# Patient Record
Sex: Female | Born: 1960 | Race: Black or African American | Hispanic: No | Smoking: Current every day smoker
Health system: Southern US, Community
[De-identification: ages and names within clinical notes are randomized; demographics above are authoritative.]

## PROBLEM LIST (undated history)

## (undated) DIAGNOSIS — I10 Essential (primary) hypertension: Secondary | ICD-10-CM

## (undated) DIAGNOSIS — N63 Unspecified lump in unspecified breast: Secondary | ICD-10-CM

## (undated) HISTORY — DX: Essential (primary) hypertension: I10

---

## 1998-01-03 HISTORY — PX: ABDOMINAL HYSTERECTOMY: SHX81

## 2003-06-12 ENCOUNTER — Emergency Department (HOSPITAL_COMMUNITY): Admission: EM | Admit: 2003-06-12 | Discharge: 2003-06-12 | Payer: Self-pay | Admitting: Emergency Medicine

## 2003-06-28 ENCOUNTER — Emergency Department (HOSPITAL_COMMUNITY): Admission: EM | Admit: 2003-06-28 | Discharge: 2003-06-29 | Payer: Self-pay | Admitting: Emergency Medicine

## 2003-07-06 ENCOUNTER — Emergency Department (HOSPITAL_COMMUNITY): Admission: EM | Admit: 2003-07-06 | Discharge: 2003-07-06 | Payer: Self-pay | Admitting: Emergency Medicine

## 2008-07-18 ENCOUNTER — Emergency Department (HOSPITAL_COMMUNITY): Admission: EM | Admit: 2008-07-18 | Discharge: 2008-07-18 | Payer: Self-pay | Admitting: Emergency Medicine

## 2014-03-31 ENCOUNTER — Encounter (HOSPITAL_COMMUNITY): Payer: Self-pay | Admitting: Neurology

## 2014-03-31 ENCOUNTER — Emergency Department (HOSPITAL_COMMUNITY)
Admission: EM | Admit: 2014-03-31 | Discharge: 2014-03-31 | Disposition: A | Discharge status: Home / Self Care | Payer: Self-pay | Attending: Emergency Medicine | Admitting: Emergency Medicine

## 2014-03-31 DIAGNOSIS — R51 Headache: Secondary | ICD-10-CM | POA: Insufficient documentation

## 2014-03-31 DIAGNOSIS — Z87891 Personal history of nicotine dependence: Secondary | ICD-10-CM | POA: Insufficient documentation

## 2014-03-31 DIAGNOSIS — I10 Essential (primary) hypertension: Secondary | ICD-10-CM | POA: Insufficient documentation

## 2014-03-31 DIAGNOSIS — Z880 Allergy status to penicillin: Secondary | ICD-10-CM | POA: Insufficient documentation

## 2014-03-31 LAB — BASIC METABOLIC PANEL WITH GFR
Anion gap: 9 (ref 5–15)
BUN: <5 mg/dL — ABNORMAL LOW (ref 6–23)
CO2: 25 mmol/L (ref 19–32)
Calcium: 9.5 mg/dL (ref 8.4–10.5)
Chloride: 105 mmol/L (ref 96–112)
Creatinine, Ser: 0.64 mg/dL (ref 0.50–1.10)
GFR calc Af Amer: >90 mL/min
GFR calc non Af Amer: >90 mL/min
Glucose, Bld: 92 mg/dL (ref 70–99)
Potassium: 4 mmol/L (ref 3.5–5.1)
Sodium: 139 mmol/L (ref 135–145)

## 2014-03-31 LAB — CBC WITH DIFFERENTIAL/PLATELET
BASOS ABS: 0 10*3/uL (ref 0.0–0.1)
Basophils Relative: 0 % (ref 0–1)
EOS PCT: 2 % (ref 0–5)
Eosinophils Absolute: 0.2 10*3/uL (ref 0.0–0.7)
HCT: 43.9 % (ref 36.0–46.0)
Hemoglobin: 14.5 g/dL (ref 12.0–15.0)
LYMPHS ABS: 3.8 10*3/uL (ref 0.7–4.0)
LYMPHS PCT: 39 % (ref 12–46)
MCH: 27.7 pg (ref 26.0–34.0)
MCHC: 33 g/dL (ref 30.0–36.0)
MCV: 83.8 fL (ref 78.0–100.0)
Monocytes Absolute: 1 10*3/uL (ref 0.1–1.0)
Monocytes Relative: 10 % (ref 3–12)
NEUTROS ABS: 4.9 10*3/uL (ref 1.7–7.7)
NEUTROS PCT: 50 % (ref 43–77)
PLATELETS: 169 10*3/uL (ref 150–400)
RBC: 5.24 MIL/uL — AB (ref 3.87–5.11)
RDW: 14.6 % (ref 11.5–15.5)
WBC: 9.9 10*3/uL (ref 4.0–10.5)

## 2014-03-31 MED ORDER — HYDROCHLOROTHIAZIDE 25 MG PO TABS
25.0000 mg | ORAL_TABLET | Freq: Every day | ORAL | Status: DC
  Administered 2014-03-31: 25 mg via ORAL
  Filled 2014-03-31: qty 1

## 2014-03-31 MED ORDER — HYDROCHLOROTHIAZIDE 25 MG PO TABS: 25.0000 mg | ORAL_TABLET | Freq: Every day | ORAL | Status: DC

## 2014-03-31 NOTE — ED Provider Notes (Signed)
CSN: [id number]     Arrival date & time 03/31/14  1413 History   First MD Initiated Contact with Patient 03/31/14 1630     Chief Complaint  Patient presents with  . 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 One month ago she had a blood pressure check at work or heart month. It was noted to be high, she is not room in the numbers. Patient stopped smoking and make some diet changes. She's been having intermittent gradual headaches for the past 2 weeks and so she went had a blood pressure recheck today. Patient was told her blood pressure was 135/106. On recheck it was 141/100. She was advised to come to the ER to get evaluation. She's not currently have a headache. She states every time she gets a headache is gradual and it seems per grossly worsen until she goes to sleep. She's not sure if is related to quitting smoking or diet changes. She denies chest pain, weakness, blurred vision, or urinary changes. Feels normal at this time. She does not have a primary doctor has never had hypertension.  History reviewed. No pertinent past medical history. History reviewed. No pertinent past surgical history. No family history on file. History  Substance Use Topics  . Smoking status: Former Games developer  . Smokeless tobacco: Not on file  . Alcohol Use: No   OB History    No data available     Review of Systems  Constitutional: Negative for fever.  Eyes: Negative for visual disturbance.  Respiratory: Negative for shortness of breath.   Cardiovascular: Negative for chest pain.  Gastrointestinal: Negative for vomiting and abdominal pain.  Genitourinary: Negative for dysuria and flank pain.  Musculoskeletal: Negative for back pain.  Neurological: Positive for headaches. Negative for dizziness and weakness.  All other systems reviewed and  are negative.     Allergies  Iohexol and Penicillins  Home Medications   Prior to Admission medications   Not on File   BP 159/101 mmHg  Pulse 67  Temp(Src) 98.2 F (36.8 C)  Resp 15  Ht  (1.651 m)  Wt 104 lb (47.174 kg)  BMI 17.31 kg/m2  SpO2 100% Physical Exam  Constitutional: She is oriented to person, place, and time. She appears well-developed and well-nourished.  HENT:  Head: Normocephalic and atraumatic.  Right Ear: External ear normal.  Left Ear: External ear normal.  Nose: Nose normal.  Eyes: EOM are normal. Pupils are equal, round, and reactive to light. Right eye exhibits no discharge. Left eye exhibits no discharge.  Neck: Normal range of motion. Neck supple.  No meningismus  Cardiovascular: Normal rate, regular rhythm and normal heart sounds.   Pulmonary/Chest: Effort normal and breath sounds normal.  Abdominal: Soft. There is no tenderness.  Neurological: She is alert and oriented to person, place, and time.  CN 2-12 grossly intact. 5/5 strength in all 4 extremities  Skin: Skin is warm and dry.  Nursing note and vitals reviewed.   ED Course  Procedures (including critical care time) Labs Review Labs Reviewed  BASIC METABOLIC PANEL - Abnormal; Notable for the following:    BUN <5 (*)    All other components within normal limits  CBC WITH DIFFERENTIAL/PLATELET - Abnormal; Notable for the following:    RBC 5.24 (*)    All other components within normal limits    Imaging Review No results found.  EKG Interpretation None      MDM   Final diagnoses:  Essential hypertension    Patient has a normal exam here. No current headache. Does have hypertension given she had hypertension one month ago I feel she is amenable to be treated. I recommended she follow-up with a PCP or further blood pressure control. Discussed strict return precautions as well as instructions on her medicines. Denies any signs or symptoms of endorgan  damage.    Pricilla LovelessScott Khaya Theissen, MD 03/31/14 [phone]

## 2014-03-31 NOTE — ED Notes (Signed)
Pt reports that her bp was high two weeks ago when checked at work, pt states she was advised by the nurse at work to change her diet and stop smoking which she states she has. Has had head/neck ache for about 1 week, states that she had bp checked again by nurse at work and it was high so nurse advised her to come in. Denies any visual changes, dizziness, or nausea/vomiting. Pt alert, oriented, nad.

## 2014-03-31 NOTE — ED Notes (Signed)
MD at bedside. 

## 2014-03-31 NOTE — Discharge Instructions (Signed)
°Emergency Department Resource Guide °1) Find a Doctor and Pay Out of Pocket °Although you won't have to find out who is covered by your insurance plan, it is a good idea to ask around and get recommendations. You will then need to call the office and see if the doctor you have chosen will accept you as a new patient and what types of options they offer for patients who are self-pay. Some doctors offer discounts or will set up payment plans for their patients who do not have insurance, but you will need to ask so you aren't surprised when you get to your appointment. ° °2) Contact Your Local Health Department °Not all health departments have doctors that can see patients for sick visits, but many do, so it is worth a call to see if yours does. If you don't know where your local health department is, you can check in your phone book. The CDC also has a tool to help you locate your state's health department, and many state websites also have listings of all of their local health departments. ° °3) Find a Walk-in Clinic °If your illness is not likely to be very severe or complicated, you may want to try a walk in clinic. These are popping up all over the country in pharmacies, drugstores, and shopping centers. They're usually staffed by nurse practitioners or physician assistants that have been trained to treat common illnesses and complaints. They're usually fairly quick and inexpensive. However, if you have serious medical issues or chronic medical problems, these are probably not your best option. ° °No Primary Care Doctor: °- Call [facility] at  832-8000 - they can help you locate a primary care doctor that  accepts your insurance, provides certain services, etc. °- Physician Referral Service- 1-800-533-3463 ° °Chronic Pain Problems: °Organization         Address  Phone   Notes  °[facility]  (336) 297-2271 Patients need to be referred by their primary care doctor.  ° °Medication  Assistance: °Organization         Address  Phone   Notes  °[place] [facility] [address] °[place], [place] [address] (336) 641-8030 --Must be a resident of [place] °-- Must have NO insurance coverage whatsoever (no Medicaid/ Medicare, etc.) °-- The pt. MUST have a primary care doctor that directs their care regularly and follows them in the community °  °[facility]  (866) 331-1348   °[employer]  (888) 892-1162   ° °Agencies that provide inexpensive medical care: °Organization         Address  Phone   Notes  °[facility]  (336) 832-8035   °[facility]    (336) 832-7272   °[facility] [address] °[place], [place] [address] (336) 832-4777   °[facility] [place] [address], °[place] (336) 271-4999   °[facility]    (336) 373-0678   °[facility]    (336) 272-1050   °[facility] ° [address], [place] Phone:  (336) 832-4444, Fax:  (336) 832-4440 Hours of Operation:  9 am - 6 pm, M-F.  Also accepts Medicaid/Medicare and self-pay.  °[facility] ° [address],  Phone: (336) 832-3150, Fax: (336) 832-3151. Hours of Operation:  8:30 am - 5:30 pm, M-F.  Also accepts Medicaid and self-pay.  °[facility] [place] [address], [place] Phone: (336) 878-6027   °[facility] [address], [place], [place] (336)723-1848, Ext. 123 Mondays & Thursdays: 7-9 AM.  First 15 patients are seen on a first come, first serve basis. °  ° °Medicaid-accepting [place] Providers: ° °Organization         Address  Phone   Notes  °[facility] [address], [place] (336) 641-2100 Also accepts self-pay patients.  °[facility] [address], [place] ° (336) 856-9996   °[facility] [address], [place]  (336) 288-8857   °[facility] [address] [place] [address], [place] (336) 299-7000   °Veita Bland [address], [place]  ° (336) 373-1557 Only accepts [place] Access Medicaid patients after they have their name applied to their card.  ° °Self-Pay (no insurance) in [place]: ° °Organization         Address  Phone   Notes  °Sickle Cell Patients, [facility] [address], [place] (336) 832-1970   °[facility] [address], [place] (336) 832-4400   °[facility] ° [address], [place] (336) 992-4800   °[facility]/Dr. Osei-Bonsu ° [address] [place] [address], [place] or [address], [place] (336) 841-8500 Phone number for both [place] and [place] locations is the same.  °[facility] [address], [place] (336) 299-0000   °[facility] [address] [place] [address], [place] or [address] (336) 852-7530 °(336) 878-2260   °[facility] [address], [place] (336) 350-1642, phone; (336) 294-5005, fax Sees patients 1st and 3rd Saturday of every month.  Must not qualify for public or private insurance (i.e. Medicaid, Medicare, [place] Health Choice, Veterans' Benefits) • Household income should be no more than 200% of the poverty level •The clinic cannot treat you if you are pregnant or think you are pregnant • Sexually transmitted diseases are not treated at the clinic.  ° ° °Dental Care: °Organization         Address  Phone  Notes  °[place] [facility] [address],  (336) 641-6152 Accepts children up to age 21 who are enrolled in Medicaid or [place] Health Choice; pregnant women with a Medicaid card; and children who have applied for Medicaid or [place] Health Choice, but were declined, whose parents can pay a reduced fee at time of service.  °[place]  [facility] [place]  [address], [place] (336) 641-7733 Accepts children up to age 21 who are enrolled in Medicaid or [place] Health Choice; pregnant women with a Medicaid card; and children who have applied for Medicaid or [place] Health Choice, but were declined, whose parents can pay a reduced fee at time of service.  °[facility] ° [address],  (336) 641-4533 Patients are seen by appointment only. Walk-ins are not accepted. [facility] will see patients 18 years of age and older. °Monday - Tuesday (8am-5pm) °Most Wednesdays (8:30-5pm) °$30 per visit, cash only  °[facility] ° [address], [place] (336) 641-4533 Patients are seen by appointment only. Walk-ins are not accepted. [facility] will see patients 18 years of age and older. °One   Wednesday Evening (Monthly: Volunteer Based).  $30 per visit, cash only  °[facility]  (919) 537-3737 for adults; Children under age 4, call Graduate Pediatric Dentistry at (919) [phone]. Children aged 4-14, please call (919) [phone] to request a pediatric application. ° Dental services are provided in all areas of dental care including fillings, crowns and bridges, complete and partial dentures, implants, gum treatment, root canals, and extractions. Preventive care is also provided. Treatment is provided to both adults and children. °Patients are selected via a lottery and there is often a waiting list. °  °[facility] [address], °[place] ° (336) 763-8833 [email or web address] °  °[facility] [address], [place], [place] (336)723-1848, Ext. 123 Second and Fourth Thursday of each month, opens at 6:30 AM; Clinic ends at 9 AM.  Patients are seen on a first-come first-served basis, and a limited number are seen during each clinic.  ° °[facility] ° [address], [place], [place] (336) 723-7904    Eligibility Requirements °You must have lived in [place], [place], or [place] counties for at least the last three months. °  You cannot be eligible for state or federal sponsored healthcare insurance, including Veterans Administration, Medicaid, or Medicare. °  You generally cannot be eligible for healthcare insurance through your employer.  °  How to apply: °Eligibility screenings are held every Tuesday and Wednesday afternoon from 1:00 pm until 4:00 pm. You do not need an appointment for the interview!  °[facility] [address], [place], [place] 336-631-2330   °[facility]  336-342-8273   °[facility]  336-703-3100   °[facility]  336-570-6415   ° °Behavioral Health Resources in the Community: °Intensive Outpatient Programs °Organization         Address  Phone  Notes  °[facility] [address], [place], [place] 336-878-6098   °[facility] [address], [place], [place] 336-832-9800   °[facility] [address], [place], [place] ° 336-882-2125   °[facility] [address],  °[place], [place] 1-800-853-5163 or 336-641-4981   °Substance Abuse Resources °Organization         Address  Phone  Notes  °[facility]  336-882-2125   °[facility]  336-784-9470   °[facility]  336-285-9073   °[facility]  336-845-3988   °Residential & Outpatient Substance Abuse Program  1-800-659-3381   °Psychological Services °Organization         Address  Phone  Notes  °[facility]  336- 832-9600   °[facility]  336- 378-7881   °[facility] [address], [place] 1-800-853-5163 or 336-641-4981   ° °Mobile Crisis Teams °Organization         Address  Phone  Notes  °[facility], Mobile Crisis Care Unit  1-877-626-1772   °Assertive °[facility] ° [address]  [place], [place] 336-834-9664   °Sharon DeEsch [address] °[place] 336-554-5454   ° °Self-Help/Support Groups °Organization         Address  Phone             Notes  °[facility] [place] - variety of support groups  336- 373-1402 Call for more information  °Narcotics Anonymous (NA), [facility] [address], °[place]   2 meetings at this location  ° °  Residential Treatment Programs °Organization         Address  Phone  Notes  °[facility] [address],    °[place]  1-866-801-8205   °[facility] ° [address], [place], [place] 704-293-8524   °[facility] [address], [place] 336-845-3988 Admissions: 8am-3pm M-F  °[facility] [address]    °[place], [place] 336-841-1104   °[facility] [address], [place], [place] 336-379-7146   °[facility] [address]  °[place], [place] 336-285-9073   °[facility] - Intensive Outpatient [address], , [place] 336-852-3033   °[facility] [address]  °[place], [place] 1-877-615-2722 or 336-784-9470   °[facility] [address] [place], [place] 336-227-7417 Accepts Medicaid  °[facility] [address]  ° [place] 1-800-659-3381 Substance Abuse/Addiction Treatment  ° °[place] Behavioral Health Resources °Organization         Address  Phone  Notes  °[facility]  (888) 581-9988   °Julie Brannon, PhD [address] [place], [place]   (336) 349-5553 or (336) 951-0000   °[facility]   [address] °[place], [place] (336) 349-4454   °[facility] [address], [place], [place] (336) 342-8316 Insurance/Medicaid/sponsorship through [facility]  °[facility] [address]                                    [place], [place] (336) 342-8316 Therapy/tele-psych/case    °[facility] [address]  ° [place], [place] (336) 349-2233    °Dr. Arfeen  (336) 349-4544   °[facility] [place]  [employer] [place] [facility] 1) [address], [place] °2) [address], [place] °3)  [address], [place] (336) 349-3220 °(336) 342-7768 ° °(336) 342-8140   °[place] Child Abuse [facility] (336) 342-1394 or (336) 342-3537 (After Hours)    ° ° °

## 2014-03-31 NOTE — ED Notes (Signed)
Pt reports went to [facility] for check up and found BP 135/106. Pt reports h/a within the last week and stopped smoking 1 week ago. Denies CP or SOB. Reports h.a for 1 week. Pt is a x 4. In NAD

## 2014-04-25 ENCOUNTER — Encounter: Payer: Self-pay | Admitting: Family Medicine

## 2014-04-25 ENCOUNTER — Ambulatory Visit: Payer: Self-pay | Attending: Family Medicine | Admitting: Family Medicine

## 2014-04-25 VITALS — BP 106/75 | HR 88 | Temp 98.2°F | Resp 18 | Ht 64.0 in | Wt 98.6 lb

## 2014-04-25 DIAGNOSIS — Z720 Tobacco use: Secondary | ICD-10-CM | POA: Insufficient documentation

## 2014-04-25 DIAGNOSIS — F17200 Nicotine dependence, unspecified, uncomplicated: Secondary | ICD-10-CM

## 2014-04-25 DIAGNOSIS — I10 Essential (primary) hypertension: Secondary | ICD-10-CM | POA: Insufficient documentation

## 2014-04-25 MED ORDER — HYDROCHLOROTHIAZIDE 12.5 MG PO TABS: 12.5000 mg | ORAL_TABLET | Freq: Every day | ORAL | Status: DC

## 2014-04-25 NOTE — Progress Notes (Signed)
Subjective:    Patient ID: Dawn Vasquez, female    DOB: 17-Oct-1960, 54 y.o.   MRN: [id number]  HPI  Dawn Vasquez is a 54 year old female with newly diagnosed hypertension who was seen at the emergency room at [facility] on 328/16 after she had been referred by the nurse at work due to elevated blood pressures of 135/106 and 141/100.Blood pressure at the ED was 159/101 and she had no other symptoms. She was placed on hydrochlorothiazide which she has been taking to date.  Interval history: Since then she has initiated lifestyle changes, has cut back on her smoking and is working on low-sodium diet. She has been compliant with exercise and is wondering if she can be taken off antihypertensives at this time. She has no other concerns today.    Past Medical History  Diagnosis Date  . Hypertension     Past Surgical History  Procedure Laterality Date  . Abdominal hysterectomy  2000    History   Social History  . Marital Status: Legally Separated    Spouse Name: N/A  . Number of Children: N/A  . Years of Education: N/A   Occupational History  . Not on file.   Social History Main Topics  . Smoking status: Current Every Day Smoker -- 0.50 packs/day  . Smokeless tobacco: Not on file  . Alcohol Use: No     Comment: 04/25/14 Quit drinking beer 1 1/2 months ago   . Drug Use: No  . Sexual Activity: Not on file   Other Topics Concern  . Not on file   Social History Narrative    Allergies  Allergen Reactions  . Iohexol Itching, Swelling, Rash and Other (See Comments)     Desc: 06-29-03 non-ionic reaction, swollen lips and itchy all over/rsm    . Penicillins Itching, Swelling and Rash    No current outpatient prescriptions on file prior to visit.   No current facility-administered medications on file prior to visit.    Review of Systems  Constitutional: normal appearing,  Eyes: PERRLA HENT: Head is atraumatic, normal sinuses, normal oropharynx, normal  appearing tonsils and palate Neck: normal range of motion, no thyromegaly, no JVD cardiovascular: normal rate and rhythm, normal heart sounds, no murmurs, rub or gallop, no pedal edema Respiratory: clear to auscultation bilaterally, no wheezes, no rales, no rhonchi Abdomen: soft, not tender to palpation, normal bowel sounds, no enlarged organs Extremities: Full ROM, no tenderness in joints Skin: warm and dry, no lesions. Neurological: alert, oriented x3, cranial nerves I-XII grossly intact Psychological: normal mood.      Objective: Filed Vitals:   04/25/14 1604  BP: 106/75  Pulse: 88  Temp: 98.2 F (36.8 C)  Resp: 18      Physical Exam  Constitutional: She is oriented to person, place, and time. She appears well-developed and well-nourished. No distress.  thin  HENT:  Head: Normocephalic.  Right Ear: External ear normal.  Left Ear: External ear normal.  Nose: Nose normal.  Mouth/Throat: Oropharynx is clear and moist.  Eyes: Conjunctivae and EOM are normal. Pupils are equal, round, and reactive to light.  Neck: Normal range of motion. No JVD present.  Cardiovascular: Normal rate, regular rhythm, normal heart sounds and intact distal pulses.  Exam reveals no gallop.   No murmur heard. Pulmonary/Chest: Effort normal and breath sounds normal. No respiratory distress. She has no wheezes. She has no rales. She exhibits no tenderness.  Abdominal: Soft. Bowel sounds are normal. She exhibits no  distension and no mass. There is no tenderness.  Musculoskeletal: Normal range of motion. She exhibits no edema or tenderness.  Neurological: She is alert and oriented to person, place, and time. She has normal reflexes.  Skin: Skin is warm and dry. She is not diaphoretic.  Psychiatric: She has a normal mood and affect.            Assessment & Plan:  54 year old female with newly diagnosed hypertension currently on antihypertensive and  blood pressure today is on the low  side.  Essential hypertension: Controlled and blood pressure is tending towards hypertension. This could be explained by lifestyle changes which the patient initiated in the last 1 month and so I am reducing her hydrochlorothiazide from 25 mg to 12.5 mg and she would need to be reassessed at her next office visit for possible discontinuation of the medication. Basic metabolic panel today to assess for hypokalemia which is a side effect of HCTZ.  Tobacco use disorder: Smoking cessation support: smoking cessation hotline: 1-800-QUIT-NOW.  Smoking cessation classes are available through [facility]. Call 470-118-5592551-581-3097 or visit our website at [email or web address].  Spent 3minutes counseling on smoking cessations and patient is not ready to quit.

## 2014-04-25 NOTE — Progress Notes (Signed)
Patient went to the ED 03/31/14. Nurse at work checked BP, BP was high, sent patient to ED.

## 2014-04-25 NOTE — Patient Instructions (Signed)

## 2014-04-26 LAB — BASIC METABOLIC PANEL
BUN: 9 mg/dL (ref 6–23)
CHLORIDE: 93 meq/L — AB (ref 96–112)
CO2: 33 mEq/L — ABNORMAL HIGH (ref 19–32)
CREATININE: 0.68 mg/dL (ref 0.50–1.10)
Calcium: 9.8 mg/dL (ref 8.4–10.5)
Glucose, Bld: 98 mg/dL (ref 70–99)
POTASSIUM: 3.7 meq/L (ref 3.5–5.3)
SODIUM: 138 meq/L (ref 135–145)

## 2014-04-30 ENCOUNTER — Telehealth: Payer: Self-pay

## 2014-04-30 NOTE — Telephone Encounter (Signed)
-----   Message from Enobong Amao, MD sent at 04/26/2014 11:58 PM EDT ----- Labs are stable 

## 2014-04-30 NOTE — Telephone Encounter (Signed)
Nurse called patient, reached voicemail. Nurse did not leave message because voicemail gave someone else's name and stated they were with [employer] [place]. There is no other phone number listed for this patient.

## 2014-05-05 NOTE — Telephone Encounter (Signed)
Nurse called patient, person answered phone, nurse asked for Dawn Vasquez, Person states "You have the wrong number".

## 2014-05-05 NOTE — Telephone Encounter (Signed)
-----   Message from Jaclyn ShaggyEnobong Amao, MD sent at 04/26/2014 11:58 PM EDT ----- Labs are stable

## 2014-05-16 ENCOUNTER — Telehealth: Payer: Self-pay | Admitting: Family Medicine

## 2014-05-16 ENCOUNTER — Encounter: Payer: Self-pay | Admitting: Family Medicine

## 2014-05-16 ENCOUNTER — Ambulatory Visit: Payer: Self-pay | Attending: Family Medicine | Admitting: Family Medicine

## 2014-05-16 VITALS — BP 99/69 | HR 91 | Temp 98.5°F | Resp 18 | Ht 64.0 in | Wt 100.2 lb

## 2014-05-16 DIAGNOSIS — Z720 Tobacco use: Secondary | ICD-10-CM | POA: Insufficient documentation

## 2014-05-16 DIAGNOSIS — I10 Essential (primary) hypertension: Secondary | ICD-10-CM | POA: Insufficient documentation

## 2014-05-16 DIAGNOSIS — F17200 Nicotine dependence, unspecified, uncomplicated: Secondary | ICD-10-CM

## 2014-05-16 MED ORDER — NICOTINE 7 MG/24HR TD PT24: 7.0000 mg | MEDICATED_PATCH | Freq: Every day | TRANSDERMAL | Status: DC

## 2014-05-16 MED ORDER — NICOTINE 14 MG/24HR TD PT24: 14.0000 mg | MEDICATED_PATCH | Freq: Every day | TRANSDERMAL | Status: DC

## 2014-05-16 NOTE — Assessment & Plan Note (Signed)
A: BP well controlled and slightly low w/o symptoms  BP Readings from Last 3 Encounters:  05/16/14 99/69  04/25/14 106/75  03/31/14 155/103   P:  Stop HCTZ completely

## 2014-05-16 NOTE — Telephone Encounter (Signed)
Patient has expressed that she is unable to come in for a BP check because the schedule stops at 4:00 and she cannot get off work; please f/u with patient to arrange an appointment time and date

## 2014-05-16 NOTE — Assessment & Plan Note (Signed)
A: Smoking, desires to quit but not ready to quit completely,  5 minutes of cessation counseling given  P: Smoking cessation support: smoking cessation hotline: 1-800-QUIT-NOW.  Smoking cessation classes are available through [facility]. Call [phone] or visit our website at [email or web address]. Start nicotine patch  14 mg daily for 6 weeks 7 mg daily for 2 weeks

## 2014-05-16 NOTE — Patient Instructions (Addendum)
[name],  Thank you for coming in today  1.  BP Readings from Last 3 Encounters:  05/16/14 99/69  04/25/14 106/75  03/31/14 155/103   Stop HCTZ completely   2. Smoking:  Smoking cessation support: smoking cessation hotline: 1-800-QUIT-NOW.  Smoking cessation classes are available through [facility] and [facility]. Call [phone] or visit our website at [email or web address]. Start nicotine patch  14 mg daily for 6 weeks 7 mg daily for 2 weeks   F/u in 2 weeks with nurse for BP check  F/u with me in 6 weeks for physical with pap   Dr. Armen PickupFunches

## 2014-05-16 NOTE — Progress Notes (Signed)
Patient presents to establish care with PCP for hypertension No c/o Smoking 8-9 cigs per day; wants to quit  Filed Vitals:   05/16/14 1623  BP: 99/69  Pulse: 91  Temp: 98.5 F (36.9 C)  Resp: 18  WT 100.2lb BMI 17.2. States difficulty gaining weight

## 2014-05-16 NOTE — Progress Notes (Signed)
   Subjective:    Patient ID: Dawn Vasquez, female    DOB: [birth year], 54 y.o.   MRN: [id number] CC: meet PCP, f.u HTN  HPI  1. CHRONIC HYPERTENSION  Disease Monitoring  Blood pressure range:  Low/normal at work   Chest pain: no   Dyspnea: no   Claudication: no   Medication compliance: yes  Medication Side Effects  Lightheadedness: no   Urinary frequency: yes   Edema: no    Soc Hx: current smoker, desires to quit  Review of Systems  Constitutional: Negative for fever and chills.  Neurological: Negative for dizziness.       Objective:   Physical Exam BP 99/69 mmHg  Pulse 91  Temp(Src) 98.5 F (36.9 C) (Oral)  Resp 18  Ht 5\' 4"  (1.626 m)  Wt 100 lb 3.2 oz (45.45 kg)  BMI 17.19 kg/m2  SpO2 99%  BP Readings from Last 3 Encounters:  05/16/14 99/69  04/25/14 106/75  03/31/14 155/103  General appearance: alert, cooperative, no distress and thin AA woman  Lungs: clear to auscultation bilaterally Heart: regular rate and rhythm, S1, S2 normal, no murmur, click, rub or gallop Extremities: extremities normal, atraumatic, no cyanosis or edema       Assessment & Plan:

## 2014-05-19 ENCOUNTER — Ambulatory Visit: Payer: Self-pay | Attending: Family Medicine

## 2015-04-05 ENCOUNTER — Encounter (HOSPITAL_COMMUNITY): Payer: Self-pay

## 2015-04-05 ENCOUNTER — Emergency Department (HOSPITAL_COMMUNITY)
Admission: EM | Admit: 2015-04-05 | Discharge: 2015-04-06 | Disposition: A | Discharge status: Home / Self Care | Payer: Self-pay | Attending: Emergency Medicine | Admitting: Emergency Medicine

## 2015-04-05 DIAGNOSIS — H6121 Impacted cerumen, right ear: Secondary | ICD-10-CM | POA: Insufficient documentation

## 2015-04-05 DIAGNOSIS — H9191 Unspecified hearing loss, right ear: Secondary | ICD-10-CM

## 2015-04-05 DIAGNOSIS — I10 Essential (primary) hypertension: Secondary | ICD-10-CM | POA: Insufficient documentation

## 2015-04-05 DIAGNOSIS — Z880 Allergy status to penicillin: Secondary | ICD-10-CM | POA: Insufficient documentation

## 2015-04-05 DIAGNOSIS — F17200 Nicotine dependence, unspecified, uncomplicated: Secondary | ICD-10-CM | POA: Insufficient documentation

## 2015-04-05 MED ORDER — DOCUSATE SODIUM 100 MG PO CAPS
100.0000 mg | ORAL_CAPSULE | Freq: Once | ORAL | Status: AC
  Administered 2015-04-05: 100 mg via ORAL
  Filled 2015-04-05: qty 1
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
## 2015-04-05 NOTE — ED Notes (Signed)
Pt reports hearing loss to her right ear, she noticed on Wednesday. No cerumen visualized in ear canal. She reports feeling pressure to her right ear. A&Ox4, ambulatory, NAD.

## 2015-04-06 NOTE — ED Notes (Signed)
Patient requested to talk with the MD that had royal blue uniform on.  Dr Denton LankSteinl has finished his shift  Dr Festus AloeGoebel in to see patient.  [facility] and the [facility] info given to help patient get established

## 2015-04-06 NOTE — ED Notes (Signed)
Pt states understanding of discharge orders, but states she does not have the money to follow-up with an outpt clinic. Given name of [facility] for possible resources.

## 2016-07-04 ENCOUNTER — Telehealth (INDEPENDENT_AMBULATORY_CARE_PROVIDER_SITE_OTHER): Payer: Self-pay | Admitting: Physician Assistant

## 2016-07-04 ENCOUNTER — Ambulatory Visit (INDEPENDENT_AMBULATORY_CARE_PROVIDER_SITE_OTHER): Payer: Self-pay | Admitting: Physician Assistant

## 2016-07-04 ENCOUNTER — Encounter (INDEPENDENT_AMBULATORY_CARE_PROVIDER_SITE_OTHER): Payer: Self-pay | Admitting: Physician Assistant

## 2016-07-04 VITALS — BP 131/90 | HR 84 | Temp 98.3°F | Ht 62.0 in | Wt 103.2 lb

## 2016-07-04 DIAGNOSIS — N63 Unspecified lump in unspecified breast: Secondary | ICD-10-CM

## 2016-07-04 NOTE — Progress Notes (Signed)
  Subjective:  Patient ID: Dawn Vasquez, female    DOB: Sep 26, 1960  Age: 56 y.o. MRN: [id number]  CC: right breast lump  HPI Dawn Vasquez is a 56 y.o. female with a PMH of HTN presents with concern for a right breast lump. Noticed a right breast lump since last month during a self check. Denies skin texture changes, breast pain, nipple discharge, weight loss, night sweats, fever, chills, or fatigue. Had a hysterectomy 18 years ago but unsure if they took ovaries also. Has never taken HRT. Last mammogram greater than 10 years and had a normal result.    ROS Review of Systems  Constitutional: Negative for chills, fever and malaise/fatigue.  Eyes: Negative for blurred vision.  Respiratory: Negative for shortness of breath.   Cardiovascular: Negative for chest pain and palpitations.  Gastrointestinal: Negative for abdominal pain and nausea.  Genitourinary: Negative for dysuria and hematuria.  Musculoskeletal: Negative for joint pain and myalgias.  Skin: Negative for rash.  Neurological: Negative for tingling and headaches.  Psychiatric/Behavioral: Negative for depression. The patient is not nervous/anxious.     Objective:  BP 131/90   Pulse 84   Temp 98.3 F (36.8 C) (Oral)   Ht 5\' 2"  (1.575 m)   Wt 103 lb 3.2 oz (46.8 kg)   SpO2 97%   BMI 18.88 kg/m   BP/Weight 07/04/2016 04/06/2015 05/16/2014  Systolic BP 131 129 99  Diastolic BP 90 73 69  Wt. (Lbs) 103.2 - 100.2  BMI 18.88 - 17.19      Physical Exam  Constitutional: She is oriented to person, place, and time.  thin, NAD, polite  HENT:  Head: Normocephalic and atraumatic.  Eyes: No scleral icterus.  Neck: Normal range of motion. Neck supple. No thyromegaly present.  Cardiovascular: Normal rate, regular rhythm and normal heart sounds.   Pulmonary/Chest: Effort normal and breath sounds normal.  Abdominal: Soft. Bowel sounds are normal. There is no tenderness.  Neurological: She is alert and oriented to person, place, and  time. No cranial nerve deficit. Coordination normal.  Skin: Skin is warm and dry. No rash noted. No erythema. No pallor.  Right and left breast symmetrical. Right breast without skin texture changes or nipple drainage. A small, freely movable nodule of approx 1 cm diameter located approximately 1.5 to 2.0 cm from the nipple at the 10 o' clock position.  Psychiatric: She has a normal mood and affect. Her behavior is normal. Thought content normal.  Vitals reviewed.    Assessment & Plan:   1. Breast lump in female - MS DIGITAL SCREENING BILATERAL; Future - Suspected fibroadenoma    Follow-up: Return in about 2 weeks (around 07/18/2016) for full physical exam.   Loletta Specteroger David Aliciana Ricciardi PA

## 2016-07-04 NOTE — Telephone Encounter (Signed)
PCP notified and will correct the order. Maryjean Mornempestt S Aaryn Sermon, CMA

## 2016-07-04 NOTE — Patient Instructions (Signed)
Mammogram A mammogram is an X-ray of the breasts that is done to check for abnormal changes. This procedure can screen for and detect any changes that may suggest breast cancer. A mammogram can also identify other changes and variations in the breast, such as:  Inflammation of the breast tissue (mastitis).  An infected area that contains a collection of pus (abscess).  A fluid-filled sac (cyst).  Fibrocystic changes. This is when breast tissue becomes denser, which can make the tissue feel rope-like or uneven under the skin.  Tumors that are not cancerous (benign).  Tell a health care provider about:  Any allergies you have.  If you have breast implants.  If you have had previous breast disease, biopsy, or surgery.  If you are breastfeeding.  Any possibility that you could be pregnant, if this applies.  If you are younger than age 25.  If you have a family history of breast cancer. What are the risks? Generally, this is a safe procedure. However, problems may occur, including:  Exposure to radiation. Radiation levels are very low with this test.  The results being misinterpreted.  The need for further tests.  The inability of the mammogram to detect certain cancers.  What happens before the procedure?  Schedule your test about 1-2 weeks after your menstrual period. This is usually when your breasts are the least tender.  If you have had a mammogram done at a different facility in the past, get the mammogram X-rays or have them sent to your current exam facility in order to compare them.  Wash your breasts and under your arms the day of the test.  Do not wear deodorants, perfumes, lotions, or powders anywhere on your body on the day of the test.  Remove any jewelry from your neck.  Wear clothes that you can change into and out of easily. What happens during the procedure?  You will undress from the waist up and put on a gown.  You will stand in front of the  X-ray machine.  Each breast will be placed between two plastic or glass plates. The plates will compress your breast for a few seconds. Try to stay as relaxed as possible during the procedure. This does not cause any harm to your breasts and any discomfort you feel will be very brief.  X-rays will be taken from different angles of each breast. The procedure may vary among health care providers and hospitals. What happens after the procedure?  The mammogram will be examined by a specialist (radiologist).  You may need to repeat certain parts of the test, depending on the quality of the images. This is commonly done if the radiologist needs a better view of the breast tissue.  Ask when your test results will be ready. Make sure you get your test results.  You may resume your normal activities. This information is not intended to replace advice given to you by your health care provider. Make sure you discuss any questions you have with your health care provider. Document Released: 12/18/1999 Document Revised: 05/25/2015 Document Reviewed: 02/28/2014 Elsevier Interactive Patient Education  2017 Elsevier Inc.  

## 2016-07-04 NOTE — Telephone Encounter (Signed)
Patient left voicemail stated [facility] informed that order is not correct and was not able to schedule appt.  Please follow up with  Patient and [facility]

## 2016-07-05 ENCOUNTER — Other Ambulatory Visit (INDEPENDENT_AMBULATORY_CARE_PROVIDER_SITE_OTHER): Payer: Self-pay | Admitting: Physician Assistant

## 2016-07-05 DIAGNOSIS — N63 Unspecified lump in unspecified breast: Secondary | ICD-10-CM

## 2016-07-13 ENCOUNTER — Telehealth (HOSPITAL_COMMUNITY): Payer: Self-pay | Admitting: *Deleted

## 2016-07-13 NOTE — Telephone Encounter (Signed)
Telephoned patient at home number and left message to return call to B[facility]CP 

## 2016-07-15 ENCOUNTER — Other Ambulatory Visit (HOSPITAL_COMMUNITY): Payer: Self-pay | Admitting: *Deleted

## 2016-07-15 DIAGNOSIS — N6310 Unspecified lump in the right breast, unspecified quadrant: Secondary | ICD-10-CM

## 2016-07-21 ENCOUNTER — Ambulatory Visit (HOSPITAL_COMMUNITY): Payer: Self-pay

## 2016-07-26 ENCOUNTER — Ambulatory Visit
Admission: RE | Admit: 2016-07-26 | Discharge: 2016-07-26 | Disposition: A | Discharge status: Home / Self Care | Payer: No Typology Code available for payment source | Source: Ambulatory Visit | Attending: Obstetrics and Gynecology | Admitting: Obstetrics and Gynecology

## 2016-07-26 ENCOUNTER — Encounter (HOSPITAL_COMMUNITY): Payer: Self-pay

## 2016-07-26 ENCOUNTER — Ambulatory Visit (HOSPITAL_COMMUNITY)
Admission: RE | Admit: 2016-07-26 | Discharge: 2016-07-26 | Disposition: A | Discharge status: Home / Self Care | Payer: Self-pay | Source: Ambulatory Visit | Attending: Obstetrics and Gynecology | Admitting: Obstetrics and Gynecology

## 2016-07-26 ENCOUNTER — Other Ambulatory Visit (HOSPITAL_COMMUNITY): Payer: Self-pay | Admitting: Obstetrics and Gynecology

## 2016-07-26 VITALS — BP 118/70 | Ht 64.0 in | Wt 106.4 lb

## 2016-07-26 DIAGNOSIS — R599 Enlarged lymph nodes, unspecified: Secondary | ICD-10-CM

## 2016-07-26 DIAGNOSIS — Z1239 Encounter for other screening for malignant neoplasm of breast: Secondary | ICD-10-CM

## 2016-07-26 DIAGNOSIS — N63 Unspecified lump in unspecified breast: Secondary | ICD-10-CM

## 2016-07-26 DIAGNOSIS — N6310 Unspecified lump in the right breast, unspecified quadrant: Secondary | ICD-10-CM

## 2016-07-26 DIAGNOSIS — N6311 Unspecified lump in the right breast, upper outer quadrant: Secondary | ICD-10-CM

## 2016-07-26 HISTORY — DX: Unspecified lump in unspecified breast: N63.0

## 2016-07-26 NOTE — Patient Instructions (Signed)
Explained breast self awareness with Dawn Vasquez. Patient did not need a Pap smear today due to her history of a hysterectomy for benign reasons. Explained to patient that she no longer needs Pap smears due to her history of a hysterectomy for benign reasons.Referred patient to the [facility] for diagnostic mammogram and right breast ultrasound. Appointment scheduled for Tuesday, July 26, 2016 at 0920. Referred to the [facility] Q[facility] and gave resources to the free smoking cessation classes at [facility]. Dawn Vasquez verbalized understanding.  Mataya Kilduff, Kathaleen Maserhristine Poll, RN 10:31 AM

## 2016-07-26 NOTE — Progress Notes (Signed)
Patient referred to [facility] by the [facility] for a right breast lump. Patient stated she first noticed in May 2018 and feels tight.  Pap Smear: Pap smear not completed today. Last Pap smear was around 18 years ago and normal per patient. Per patient has a history of an abnormal Pap smear 19 years ago that a colposcopy was completed for follow up. Patient has a history of a hysterectomy for fibroids 18 years ago. Patient no longer needs Pap smears due to her history of a hysterectomy for benign reasons per BCCCP and ACOG guidelines. No Pap smear results are in EPIC.  Physical exam: Breasts Breasts symmetrical. No skin abnormalities bilateral breasts. No nipple retraction bilateral breasts. No nipple discharge bilateral breasts. No lymphadenopathy. No lumps palpated left breast. Palpated a lump within the right breast at 10 o'clock 1.5 cm from the nipple. Complaints of tenderness when palpated lump. Referred patient to the [facility] for diagnostic mammogram and right breast ultrasound. Appointment scheduled for Tuesday, July 26, 2016 at 0920.        Pelvic/Bimanual No Pap smear completed today since patient has a history of a hysterectomy for benign reasons. Pap smear not indicated per BCCCP guidelines.   Smoking History: Patient is a current smoker. Discussed smoking cessation with patient. Referred to the [facility] and gave resources to the free smoking cessation classes at [facility].  Patient Navigation: Patient education provided. Access to services provided for patient through [facility]CP program.   Colorectal Cancer Screening: Per patient has never had a colonoscopy completed. No complaints today. FIT Test given to patient to complete and return to [facility]CP.

## 2016-07-27 ENCOUNTER — Ambulatory Visit
Admission: RE | Admit: 2016-07-27 | Discharge: 2016-07-27 | Disposition: A | Discharge status: Home / Self Care | Payer: No Typology Code available for payment source | Source: Ambulatory Visit | Attending: Obstetrics and Gynecology | Admitting: Obstetrics and Gynecology

## 2016-07-27 ENCOUNTER — Other Ambulatory Visit (HOSPITAL_COMMUNITY): Payer: Self-pay | Admitting: Obstetrics and Gynecology

## 2016-07-27 ENCOUNTER — Encounter (HOSPITAL_COMMUNITY): Payer: Self-pay | Admitting: *Deleted

## 2016-07-27 ENCOUNTER — Ambulatory Visit (INDEPENDENT_AMBULATORY_CARE_PROVIDER_SITE_OTHER): Payer: Self-pay | Admitting: Physician Assistant

## 2016-07-27 DIAGNOSIS — R599 Enlarged lymph nodes, unspecified: Secondary | ICD-10-CM

## 2016-07-27 DIAGNOSIS — N6310 Unspecified lump in the right breast, unspecified quadrant: Secondary | ICD-10-CM

## 2016-08-12 ENCOUNTER — Other Ambulatory Visit: Payer: Self-pay | Admitting: Obstetrics and Gynecology

## 2016-08-12 ENCOUNTER — Encounter (HOSPITAL_COMMUNITY): Payer: Self-pay

## 2016-08-16 LAB — FECAL OCCULT BLOOD, IMMUNOCHEMICAL: FECAL OCCULT BLD: NEGATIVE

## 2017-07-24 ENCOUNTER — Telehealth (HOSPITAL_COMMUNITY): Payer: Self-pay | Admitting: *Deleted

## 2017-07-24 NOTE — Telephone Encounter (Signed)
Telephoned patient at home number and left message to return call to B[facility]CP 

## 2017-08-25 ENCOUNTER — Telehealth (HOSPITAL_COMMUNITY): Payer: Self-pay

## 2017-08-25 NOTE — Telephone Encounter (Signed)
Tried to call left a message to call [facility] °

## 2017-09-12 ENCOUNTER — Telehealth (HOSPITAL_COMMUNITY): Payer: Self-pay | Admitting: *Deleted

## 2017-09-12 NOTE — Telephone Encounter (Signed)
Telephoned patient at home number and left message to return call to B[facility]CP 

## 2017-09-14 ENCOUNTER — Other Ambulatory Visit: Payer: Self-pay | Admitting: Obstetrics and Gynecology

## 2017-09-14 DIAGNOSIS — Z1231 Encounter for screening mammogram for malignant neoplasm of breast: Secondary | ICD-10-CM

## 2017-11-21 ENCOUNTER — Ambulatory Visit
Admission: RE | Admit: 2017-11-21 | Discharge: 2017-11-21 | Disposition: A | Discharge status: Home / Self Care | Payer: No Typology Code available for payment source | Source: Ambulatory Visit | Attending: Obstetrics and Gynecology | Admitting: Obstetrics and Gynecology

## 2017-11-21 ENCOUNTER — Ambulatory Visit (HOSPITAL_COMMUNITY)
Admission: RE | Admit: 2017-11-21 | Discharge: 2017-11-21 | Disposition: A | Discharge status: Home / Self Care | Payer: No Typology Code available for payment source | Source: Ambulatory Visit | Attending: Obstetrics and Gynecology | Admitting: Obstetrics and Gynecology

## 2017-11-21 ENCOUNTER — Encounter (HOSPITAL_COMMUNITY): Payer: Self-pay

## 2017-11-21 VITALS — BP 110/68 | Wt 110.0 lb

## 2017-11-21 DIAGNOSIS — Z1239 Encounter for other screening for malignant neoplasm of breast: Secondary | ICD-10-CM

## 2017-11-21 DIAGNOSIS — Z1231 Encounter for screening mammogram for malignant neoplasm of breast: Secondary | ICD-10-CM

## 2017-11-21 NOTE — Patient Instructions (Addendum)
Explained breast self awareness with Dawn Vasquez. Patient did not need a Pap smear today due to her history of a hysterectomy for benign reasons. Explained to patient that she no longer needs Pap smears due to her history of a hysterectomy for benign reasons. Referred patient to [facility] for a screening mammogram. Appointment scheduled for Tuesday, November 21, 2017 at 1540. Let patient know that the Breast Center will follow-up with her within a couple of weeks with results of mammogram by letter or phone. Discussed smoking cessation with patient. Referred to the [facility] and gave resources to free smoking cessation classes at [facility]. Dawn Vasquez verbalized understanding.  Gustav Knueppel, Kathaleen Maserhristine Poll, RN 2:17 PM

## 2017-11-21 NOTE — Progress Notes (Signed)
No complaints today.   Pap Smear: Pap smear not completed today. Last Pap smear was around 19 years ago and normal per patient. Per patient has a history of an abnormal Pap smear 20 years ago that a colposcopy was completed for follow up. Patient has a history of a hysterectomy for fibroids 19 years ago. Patient no longer needs Pap smears due to her history of a hysterectomy for benign reasons per BCCCP and ACOG guidelines. No Pap smear results are in EPIC.  Physical exam: Breasts Breasts symmetrical. No skin abnormalities bilateral breasts. No nipple retraction bilateral breasts. No nipple discharge bilateral breasts. No lymphadenopathy. No lumps palpated bilateral breasts. No complaints of pain or tenderness on exam. Referred patient to the [facility] for a screening mammogram. Appointment scheduled for Tuesday, November 21, 2017 at 1540.        Pelvic/Bimanual No Pap smear completed today since patient has a history of a hysterectomy for benign reasons. Pap smear not indicated per BCCCP guidelines.   Smoking History: Patient is a current smoker. Discussed smoking cessation with patient. Referred to the [facility] and gave resources to free smoking cessation classes at [facility].  Patient Navigation: Patient education provided. Access to services provided for patient through B[facility]CP program.   Colorectal Cancer Screening: Per patient has never had a colonoscopy completed. FIT Test completed 08/12/2016 and negative. No complaints today. FIT Test given to patient to complete and return to [facility]CP.  Breast and Cervical Cancer Risk Assessment: Patient has no family history of breast cancer, known genetic mutations, or radiation treatment to the chest before age 57. Patient has no history of cervical dysplasia, immunocompromised, or DES exposure in-utero.  Risk Assessment    Risk Scores      11/21/2017   Last edited by: Lynnell DikeHolland, Sabrina H, LPN   5-year risk: 1.1 %   Lifetime  risk: 5.8 %

## 2017-12-04 ENCOUNTER — Other Ambulatory Visit: Payer: Self-pay

## 2017-12-04 ENCOUNTER — Encounter (HOSPITAL_COMMUNITY): Payer: Self-pay | Admitting: *Deleted

## 2017-12-17 LAB — FECAL OCCULT BLOOD, IMMUNOCHEMICAL: FECAL OCCULT BLD: NEGATIVE

## 2017-12-18 ENCOUNTER — Encounter (HOSPITAL_COMMUNITY): Payer: Self-pay

## 2019-04-09 ENCOUNTER — Emergency Department (HOSPITAL_COMMUNITY)
Admission: EM | Admit: 2019-04-09 | Discharge: 2019-04-09 | Disposition: A | Discharge status: Home / Self Care | Payer: No Typology Code available for payment source | Attending: Emergency Medicine | Admitting: Emergency Medicine

## 2019-04-09 ENCOUNTER — Other Ambulatory Visit: Payer: Self-pay

## 2019-04-09 ENCOUNTER — Encounter (HOSPITAL_COMMUNITY): Payer: Self-pay

## 2019-04-09 ENCOUNTER — Emergency Department (HOSPITAL_COMMUNITY): Payer: No Typology Code available for payment source

## 2019-04-09 DIAGNOSIS — I10 Essential (primary) hypertension: Secondary | ICD-10-CM | POA: Insufficient documentation

## 2019-04-09 DIAGNOSIS — F17210 Nicotine dependence, cigarettes, uncomplicated: Secondary | ICD-10-CM | POA: Insufficient documentation

## 2019-04-09 DIAGNOSIS — R079 Chest pain, unspecified: Secondary | ICD-10-CM | POA: Insufficient documentation

## 2019-04-09 DIAGNOSIS — M549 Dorsalgia, unspecified: Secondary | ICD-10-CM | POA: Insufficient documentation

## 2019-04-09 LAB — CBC
HCT: 39.7 % (ref 36.0–46.0)
Hemoglobin: 12.4 g/dL (ref 12.0–15.0)
MCH: 25.1 pg — ABNORMAL LOW (ref 26.0–34.0)
MCHC: 31.2 g/dL (ref 30.0–36.0)
MCV: 80.4 fL (ref 80.0–100.0)
Platelets: 195 10*3/uL (ref 150–400)
RBC: 4.94 MIL/uL (ref 3.87–5.11)
RDW: 15.2 % (ref 11.5–15.5)
WBC: 10.5 10*3/uL (ref 4.0–10.5)
nRBC: 0 % (ref 0.0–0.2)

## 2019-04-09 LAB — BASIC METABOLIC PANEL
Anion gap: 10 (ref 5–15)
BUN: 5 mg/dL — ABNORMAL LOW (ref 6–20)
CO2: 25 mmol/L (ref 22–32)
Calcium: 8.8 mg/dL — ABNORMAL LOW (ref 8.9–10.3)
Chloride: 103 mmol/L (ref 98–111)
Creatinine, Ser: 0.85 mg/dL (ref 0.44–1.00)
GFR calc Af Amer: >60 mL/min (ref >60)
GFR calc non Af Amer: >60 mL/min (ref >60)
Glucose, Bld: 94 mg/dL (ref 70–99)
Potassium: 3.4 mmol/L — ABNORMAL LOW (ref 3.5–5.1)
Sodium: 138 mmol/L (ref 135–145)

## 2019-04-09 LAB — I-STAT BETA HCG BLOOD, ED (MC, WL, AP ONLY): I-stat hCG, quantitative: <5 m[IU]/mL (ref <5)

## 2019-04-09 LAB — TROPONIN I (HIGH SENSITIVITY)
Troponin I (High Sensitivity): 4 ng/L (ref <18)
Troponin I (High Sensitivity): 5 ng/L (ref <18)

## 2019-04-09 MED ORDER — NAPROXEN 500 MG PO TABS: 500.0000 mg | ORAL_TABLET | Freq: Two times a day (BID) | ORAL | 0 refills | Status: AC

## 2019-04-09 MED ORDER — METHOCARBAMOL 500 MG PO TABS: 500.0000 mg | ORAL_TABLET | Freq: Three times a day (TID) | ORAL | 0 refills | Status: AC | PRN

## 2019-04-09 MED ORDER — SODIUM CHLORIDE 0.9% FLUSH: 3.0000 mL | Freq: Once | INTRAVENOUS | Status: DC

## 2019-04-09 MED ORDER — POTASSIUM CHLORIDE CRYS ER 20 MEQ PO TBCR
40.0000 meq | EXTENDED_RELEASE_TABLET | Freq: Once | ORAL | Status: AC
  Administered 2019-04-09: 23:00:00 40 meq via ORAL
  Filled 2019-04-09: qty 2

## 2019-04-09 NOTE — ED Triage Notes (Addendum)
Pt c/o chest pain intermittently for the past month, occasionally sharp and occasionally radiating to the back but typically stays in the left of her chest.  Denies SOB, dizziness, nasuea/vomtting. Symptoms not worse today than they have been.

## 2019-04-09 NOTE — ED Provider Notes (Signed)
[facility] EMERGENCY DEPARTMENT Provider Note   CSN: [id number] Arrival date & time: 04/09/19  1255     History Chief Complaint  Patient presents with  . Chest Pain    Dawn Vasquez is a 59 y.o. female with a history of tobacco abuse, hypertension, who is s/p abdominal hysterectomy who presents to ED with complaints of intermittent chest and back pain for the past 1.5 months.  Patient states she has had intermittent pain to both the left chest and the left back, she states that these pains do not necessarily occur together, typically occur separately.  States the pain in her back occurs more frequently, feels like an aching pain with occasional stabbing sensation and that the pains in her chest occur intermittently feeling like a tightness/sharp.  She states symptoms seem to be triggered when she plays excessive videogames, she uses her left hand more so to control the games, and occasionally chest bothers her when she is hungry. She has tried anti-acids and thinks this has helped out of "placebo".  No other significant alleviating or rating factors.  Denies fever, chills, nausea, vomiting, diaphoresis, cough, dizziness, lightheadedness, syncope, dyspnea, leg pain/swelling, hemoptysis, recent surgery/trauma, recent long travel, hormone use, personal hx of cancer, or hx of DVT/PE.  Denies personal history of CAD or early family history of CAD. Denies trauma or heavy lifting.      HPI     Past Medical History:  Diagnosis Date  . Breast mass 07/26/2016  . Hypertension     Patient Active Problem List   Diagnosis Date Noted  . Hypertension 04/25/2014  . Tobacco use disorder 04/25/2014    Past Surgical History:  Procedure Laterality Date  . ABDOMINAL HYSTERECTOMY  2000     OB History    Gravida  4   Para  3   Term  3   Preterm      AB  1   Living  3     SAB      TAB  1   Ectopic      Multiple      Live Births  3           Family History   Problem Relation Age of Onset  . Hypertension Mother   . Cancer Mother        colon  . Hypertension Father   . Diabetes Paternal Uncle     Social History   Tobacco Use  . Smoking status: Current Every Day Smoker    Packs/day: 0.50  . Smokeless tobacco: Never Used  . Tobacco comment: Smoking 8-9 cigs/day  Substance Use Topics  . Alcohol use: No    Alcohol/week: 0.0 standard drinks    Comment: 04/25/14 Quit drinking beer 1 1/2 months ago   . Drug use: No    Home Medications Prior to Admission medications   Not on File    Allergies    Iohexol and Penicillins  Review of Systems   Review of Systems  Constitutional: Negative for chills, diaphoresis and fever.  Respiratory: Negative for cough and shortness of breath.   Cardiovascular: Positive for chest pain. Negative for palpitations and leg swelling.  Gastrointestinal: Negative for abdominal pain, constipation, diarrhea, nausea and vomiting.  Neurological: Negative for dizziness, syncope, facial asymmetry, weakness, light-headedness, numbness and headaches.  All other systems reviewed and are negative.   Physical Exam Updated Vital Signs BP 124/83 (BP Location: Right Arm)   Pulse 92   Temp 98.2 F (  36.8 C) (Oral)   Resp 18   Ht 5\' 5"  (1.651 m)   Wt 56 kg   SpO2 100%   BMI 20.54 kg/m   Physical Exam Vitals and nursing note reviewed.  Constitutional:      General: She is not in acute distress.    Appearance: Normal appearance. She is well-developed. She is not ill-appearing or toxic-appearing.  HENT:     Head: Normocephalic and atraumatic.  Eyes:     General:        Right eye: No discharge.        Left eye: No discharge.     Conjunctiva/sclera: Conjunctivae normal.  Neck:     Comments: No midline tenderness.  Cardiovascular:     Rate and Rhythm: Normal rate and regular rhythm.     Pulses:          Radial pulses are 2+ on the right side and 2+ on the left side.  Pulmonary:     Effort: Pulmonary effort  is normal. No respiratory distress.     Breath sounds: Normal breath sounds. No wheezing, rhonchi or rales.  Chest:     Chest wall: Tenderness (left anterior chest wall without overlying skin changes or palpable crepitus) present.  Abdominal:     General: There is no distension.     Palpations: Abdomen is soft.     Tenderness: There is no abdominal tenderness.  Musculoskeletal:     Cervical back: Normal range of motion and neck supple. Muscular tenderness (mild left trapezius region) present.     Comments: Upper extremities: No obvious deformity, appreciable swelling, edema, erythema, ecchymosis, warmth, or open wounds. Patient has intact AROM throughout.]  No focal bony tenderness to palpation. Lower extremities: No pitting edema or calf tenderness.  Skin:    General: Skin is warm and dry.     Capillary Refill: Capillary refill takes less than 2 seconds.     Findings: No rash.  Neurological:     Mental Status: She is alert.     Comments: Alert. Clear speech. Sensation grossly intact to bilateral upper extremities. 5/5 symmetric grip strength. Ambulatory.   Psychiatric:        Mood and Affect: Mood normal.        Behavior: Behavior normal.     ED Results / Procedures / Treatments   Labs (all labs ordered are listed, but only abnormal results are displayed) Labs Reviewed  BASIC METABOLIC PANEL - Abnormal; Notable for the following components:      Result Value   Potassium 3.4 (*)    BUN 5 (*)    Calcium 8.8 (*)    All other components within normal limits  CBC - Abnormal; Notable for the following components:   MCH 25.1 (*)    All other components within normal limits  I-STAT BETA HCG BLOOD, ED ([facility], [facility], [facility] ONLY)  TROPONIN I (HIGH SENSITIVITY)  TROPONIN I (HIGH SENSITIVITY)    EKG EKG Interpretation  Date/Time:  Tuesday April 09 2019 13:16:31 EDT Ventricular Rate:  87 PR Interval:  158 QRS Duration: 72 QT Interval:  384 QTC Calculation: 462 R Axis:   81 Text  Interpretation: Normal sinus rhythm Minimal voltage criteria for LVH, may be normal variant ( Sokolow-Lyon ) Borderline ECG Confirmed by 03-01-2001 [phone]) on 04/09/2019 10:08:08 PM   Radiology DG Chest 2 View  Result Date: 04/09/2019 CLINICAL DATA:  Chest pain EXAM: CHEST - 2 VIEW COMPARISON:  None. FINDINGS: Lungs are clear. Heart  size and pulmonary vascularity are normal. No adenopathy. No pneumothorax. No bone lesions. IMPRESSION: Lungs clear.  Cardiac silhouette within normal limits. Electronically Signed   By: Lowella Grip III M.D.   On: 04/09/2019 13:31    Procedures Procedures (including critical care time)  Medications Ordered in ED Medications  sodium chloride flush (NS) 0.9 % injection 3 mL (has no administration in time range)  potassium chloride SA (KLOR-CON) CR tablet 40 mEq (40 mEq Oral Given 04/09/19 2248)    ED Course  I have reviewed the triage vital signs and the nursing notes.  Pertinent labs & imaging results that were available during my care of the patient were reviewed by me and considered in my medical decision making (see chart for details).    MDM Rules/Calculators/A&P                      Patient presents to the emergency department with chest pain. Patient nontoxic appearing, in no apparent distress, vitals without significant abnormality. Fairly benign physical exam- does have some reproducibility of pain with L chest wall and L trapezius palpation. No overlying rash to raise concern for shingles.   DDX: ACS, pulmonary embolism, dissection, pneumothorax, pneumonia, arrhythmia, severe anemia, MSK, GERD, anxiety. Evaluation initiated with labs, EKG, and CXR. Patient on cardiac monitor.   EKG: no STEMI, findings of LVH.  Additional history obtained:  Additional history obtained from chart review.  Lab Tests:  Ordered, reviewed, and interpreted labs, which included:  CBC: No anemia/leukocytosis.  BMP: Mild hypokalemia and hypocalcemia. Renal  function preserved. Troponin: 4, 5  Imaging Studies ordered:  I ordered imaging studies which included CXR, I independently visualized and interpreted imaging which showed no acute process, negative for infiltrate, effusion, pneumothorax, or fracture/dislocation.  Medicines ordered:  I ordered medication potassium supplement for mild hypokalemia.    Low HEAR score- EKG without STEMI, delta troponin negative, doubt ACS. Patient is low risk wells, doubt pulmonary embolism. Pain is not a tearing sensation, symmetric pulses, no widening of mediastinum on CXR, doubt dissection. Unclear definitive etiology however given triggered by excess video games and some reproducibility will trial tx for MSK pain. Patient has appeared hemodynamically stable throughout ER visit and appears safe for discharge with close PCP follow up. I discussed results, treatment plan, need for PCP follow-up, and return precautions with the patient. Provided opportunity for questions, patient confirmed understanding and is in agreement with plan.   Portions of this note were generated with [employer]. Dictation errors may occur despite best attempts at proofreading.  Final Clinical Impression(s) / ED Diagnoses Final diagnoses:  Chest pain, unspecified type    Rx / DC Orders ED Discharge Orders         Ordered    naproxen (NAPROSYN) 500 MG tablet  2 times daily     04/09/19 2237    methocarbamol (ROBAXIN) 500 MG tablet  Every 8 hours PRN     04/09/19 2237           Amaryllis Dyke, PA-C 04/09/19 2347    Carmin Muskrat, MD 04/09/19 2356

## 2019-04-09 NOTE — Discharge Instructions (Signed)
You were seen in the emergency department today for chest pain. Your work-up in the emergency department has been overall reassuring. Your labs have been fairly normal and or similar to previous blood work you have had done- your potassium and calcium are somewhat low- we have given you potassium in the ER and have provided diet recommendations- have these rechecked by primary care provider within 1-2 weeks. Your EKG and the enzyme we use to check your heart did not show an acute heart attack at this time. Your chest x-ray was normal.   We are sending you home with the following medicines to help with your symptoms:  - Naproxen is a nonsteroidal anti-inflammatory medication that will help with pain and swelling. Be sure to take this medication as prescribed with food, 1 pill every 12 hours,  It should be taken with food, as it can cause stomach upset, and more seriously, stomach bleeding. Do not take other nonsteroidal anti-inflammatory medications with this such as Advil, Motrin, Aleve, Mobic, Goodie Powder, or Motrin.    - Robaxin is the muscle relaxer I have prescribed, this is meant to help with muscle tightness. Be aware that this medication may make you drowsy therefore the first time you take this it should be at a time you are in an environment where you can rest. Do not drive or operate heavy machinery when taking this medication. Do not drink alcohol or take other sedating medications with this medicine such as narcotics or benzodiazepines.   You make take Tylenol per over the counter dosing with these medications.   We have prescribed you new medication(s) today. Discuss the medications prescribed today with your pharmacist as they can have adverse effects and interactions with your other medicines including over the counter and prescribed medications. Seek medical evaluation if you start to experience new or abnormal symptoms after taking one of these medicines, seek care immediately if you  start to experience difficulty breathing, feeling of your throat closing, facial swelling, or rash as these could be indications of a more serious allergic reaction  Please rest from playing video games for 1 week to see if this helps some.   We would like you to follow up closely with your primary care provider and/or the cardiologist provided in your discharge instructions within 1-3 days. Return to the ER immediately should you experience any new or worsening symptoms including but not limited to return of pain, worsened pain, vomiting, shortness of breath, dizziness, lightheadedness, passing out, or any other concerns that you may have.

## 2020-01-31 IMAGING — MG DIGITAL SCREENING BILATERAL MAMMOGRAM WITH TOMO AND CAD
6 of 10 series · 6 of 30 positions shown · non-contrast
Comparison: Previous exam(s).

CLINICAL DATA: Screening.

EXAM:
DIGITAL SCREENING BILATERAL MAMMOGRAM WITH TOMO AND CAD

[L CC synth-2D]
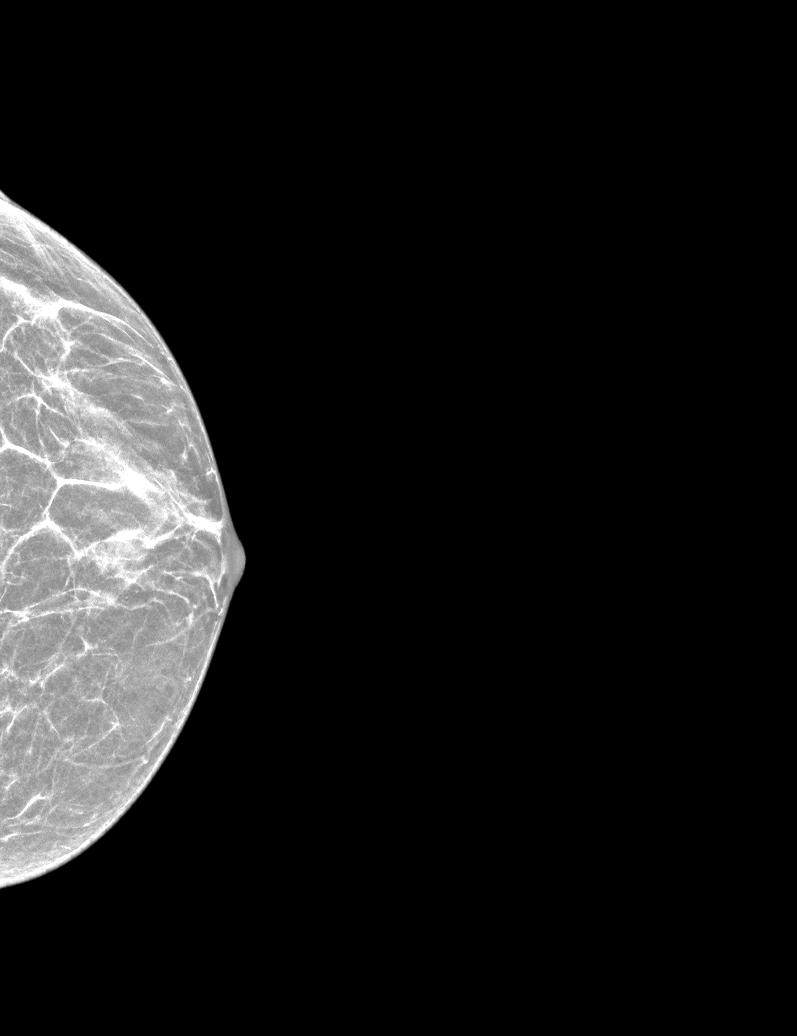

[L MLO synth-2D]
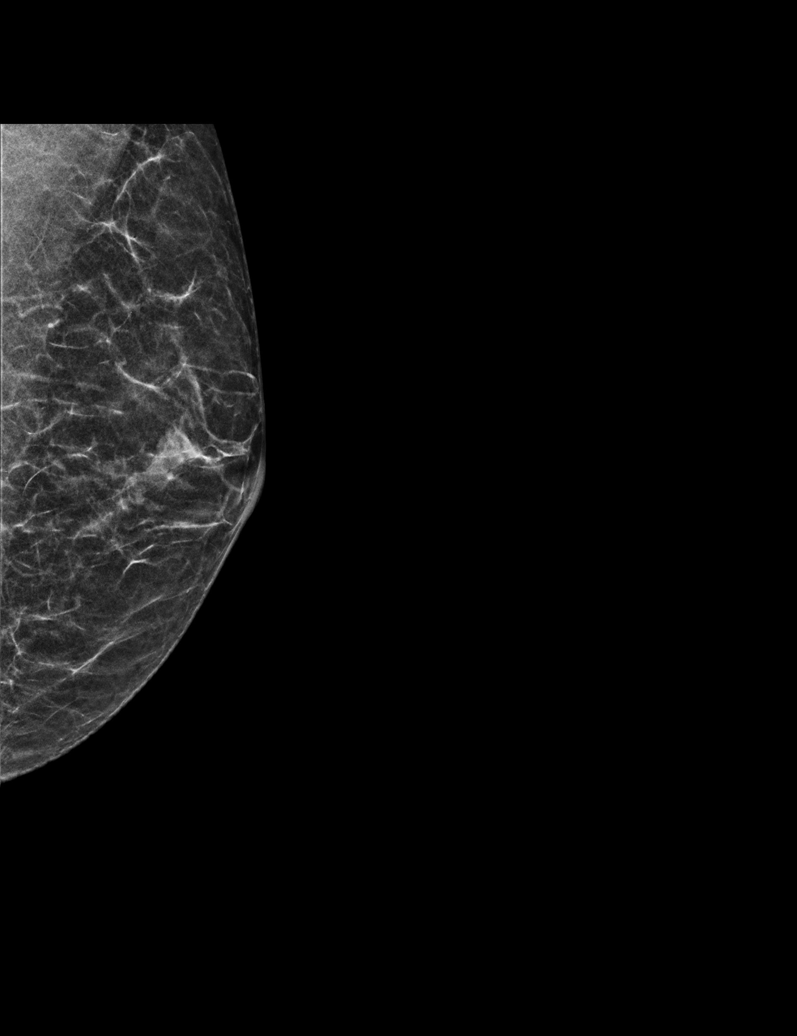

[R CC synth-2D (1 of 2)]
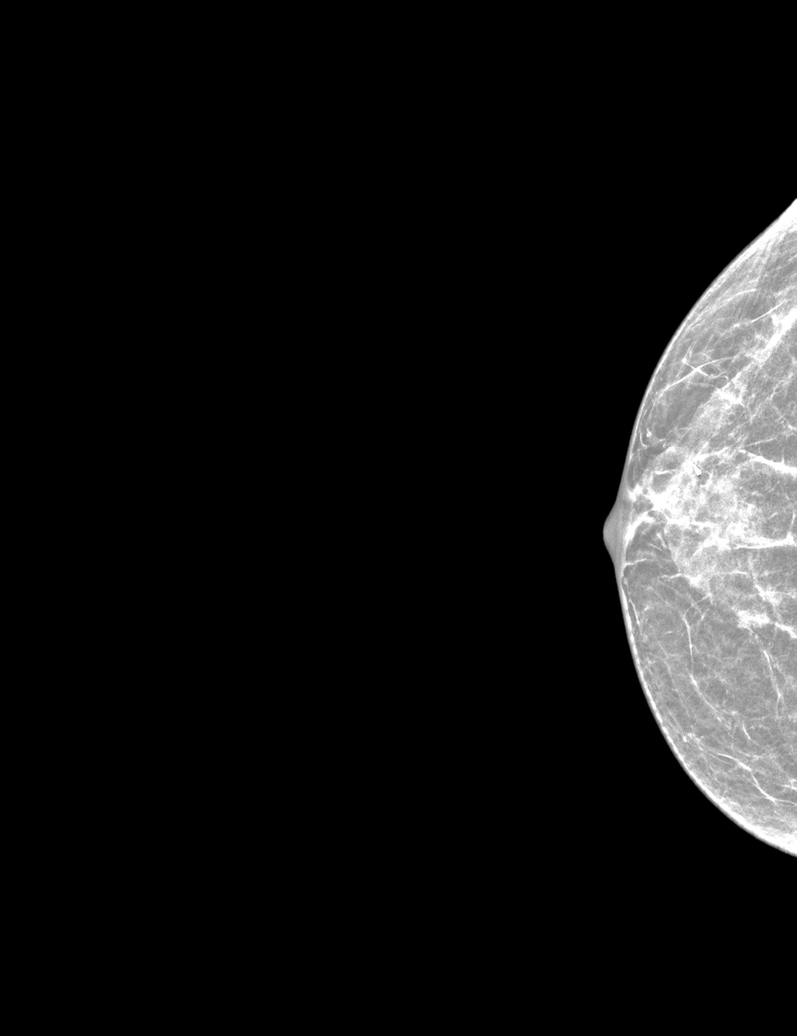

[R MLO synth-2D]
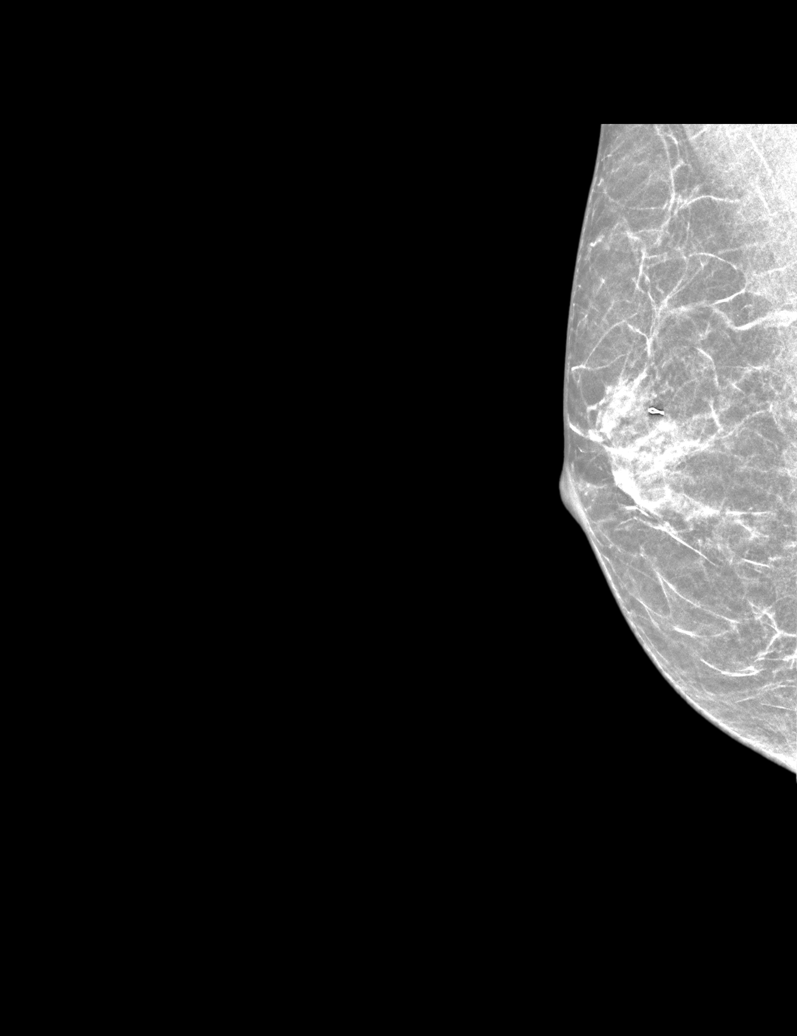

[R CC synth-2D (2 of 2)]
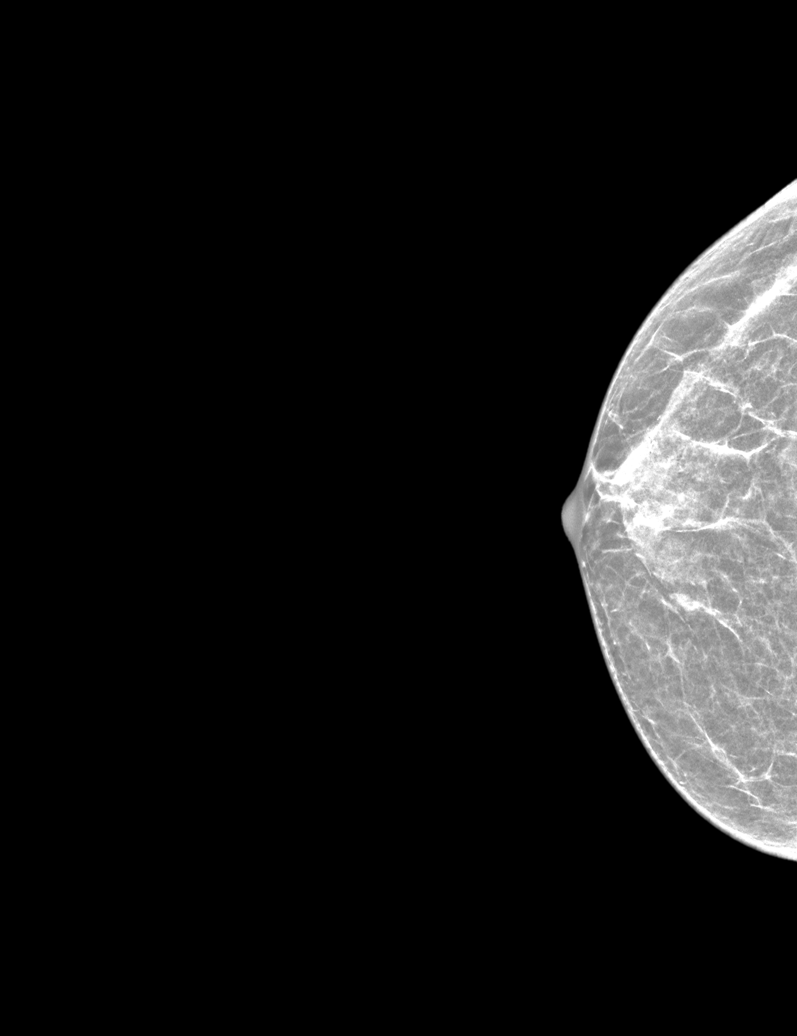

[R MLO tomo · tomo slice 19/36.0]
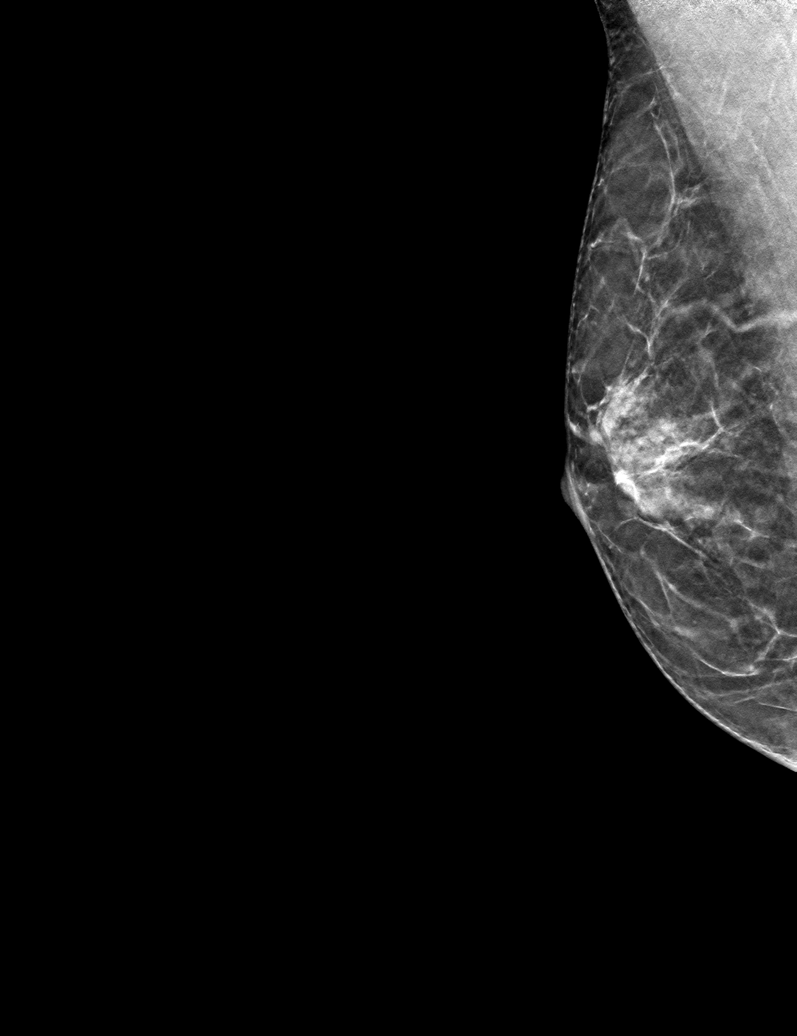

[6 of 30 positions shown; findings below may reference images not displayed]

ACR Breast Density Category b: There are scattered areas of
fibroglandular density.
FINDINGS: There are no findings suspicious for malignancy. Images were
processed with CAD.
IMPRESSION: No mammographic evidence of malignancy. A result letter of this
screening mammogram will be mailed directly to the patient.

RECOMMENDATION:
Screening mammogram in one year. (Code:[id number])

BI-RADS CATEGORY  1: Negative.

## 2023-07-17 LAB — COLOGUARD: COLOGUARD: POSITIVE — AB
